# Patient Record
Sex: Male | Born: 1955 | Hispanic: Refuse to answer
Health system: Midwestern US, Community
[De-identification: ages and names within clinical notes are randomized; demographics above are authoritative.]

## PROBLEM LIST (undated history)

## (undated) DIAGNOSIS — I10 Essential (primary) hypertension: Secondary | ICD-10-CM

## (undated) DIAGNOSIS — Z85820 Personal history of malignant melanoma of skin: Secondary | ICD-10-CM

## (undated) DIAGNOSIS — R972 Elevated prostate specific antigen [PSA]: Secondary | ICD-10-CM

## (undated) DIAGNOSIS — M1990 Unspecified osteoarthritis, unspecified site: Secondary | ICD-10-CM

## (undated) HISTORY — PX: VASECTOMY: SHX75

---

## 2015-12-14 ENCOUNTER — Emergency Department: Payer: BLUE CROSS/BLUE SHIELD

## 2015-12-14 ENCOUNTER — Emergency Department
Admission: EM | Admit: 2015-12-14 | Discharge: 2015-12-14 | Disposition: A | Discharge status: Home / Self Care | Payer: BLUE CROSS/BLUE SHIELD | Attending: Emergency Medicine | Admitting: Emergency Medicine

## 2015-12-14 ENCOUNTER — Encounter: Payer: Self-pay | Admitting: Emergency Medicine

## 2015-12-14 DIAGNOSIS — I10 Essential (primary) hypertension: Secondary | ICD-10-CM | POA: Insufficient documentation

## 2015-12-14 DIAGNOSIS — R0602 Shortness of breath: Secondary | ICD-10-CM | POA: Diagnosis present

## 2015-12-14 DIAGNOSIS — Z87891 Personal history of nicotine dependence: Secondary | ICD-10-CM | POA: Diagnosis not present

## 2015-12-14 DIAGNOSIS — K219 Gastro-esophageal reflux disease without esophagitis: Secondary | ICD-10-CM

## 2015-12-14 HISTORY — DX: Essential (primary) hypertension: I10

## 2015-12-14 HISTORY — DX: Unspecified osteoarthritis, unspecified site: M19.90

## 2015-12-14 MED ORDER — PANTOPRAZOLE SODIUM 40 MG PO TBEC
40.0000 mg | DELAYED_RELEASE_TABLET | Freq: Once | ORAL | Status: AC
  Administered 2015-12-14: 40 mg via ORAL
  Filled 2015-12-14: qty 1

## 2015-12-14 MED ORDER — PANTOPRAZOLE SODIUM 40 MG PO TBEC: 40.0000 mg | DELAYED_RELEASE_TABLET | Freq: Every day | ORAL | 0 refills | Status: AC

## 2015-12-14 NOTE — ED Triage Notes (Signed)
Pt ambulatory to triage with steady with c/o shortness of breath this morning. Pt states "I felt like I was having to breath through something." Pt reports after "burping I was able to catch my breath." Pt denies chest pain or dizziness. Pt speaking in complete sentences, no increased work in breathing noted.

## 2015-12-14 NOTE — ED Notes (Signed)
Patient transported to X-ray 

## 2015-12-14 NOTE — ED Provider Notes (Signed)
[facility] Emergency Department Provider Note    First MD Initiated Contact with Patient 12/14/15 0310     (approximate)  I have reviewed the triage vital signs and the nursing notes.   HISTORY  Chief Complaint Shortness of Breath    HPI Mathew Hebert is a 60 y.o. male with no complaints at present however states that he awoke this morning noted it take several vomiting and his throat and difficulty breathing as well patient states that he went to the bathroom and after belching repetitively he was able to catch his breath". Patient denies any chest pain no dizziness. Patient states all symptoms resolved at this time   Past Medical History:  Diagnosis Date  . Arthritis   . Hypertension     There are no active problems to display for this patient.   Past Surgical History:  Procedure Laterality Date  . VASECTOMY      Prior to Admission medications   Medication Sig Start Date End Date Taking? Authorizing Provider  pantoprazole (PROTONIX) 40 MG tablet Take 1 tablet (40 mg total) by mouth daily. 12/14/15 01/13/16  Darci Currentandolph N Brown, MD    Allergies No known drug allergies No family history on file.  Social History Social History  Substance Use Topics  . Smoking status: Former Games developermoker  . Smokeless tobacco: Never Used  . Alcohol use Yes    Review of Systems Constitutional: No fever/chills Eyes: No visual changes. ENT: No sore throat. Cardiovascular: Denies chest pain. Respiratory: Denies shortness of breath. Gastrointestinal: No abdominal pain.  No nausea, no vomiting.  No diarrhea.  No constipation. Genitourinary: Negative for dysuria. Musculoskeletal: Negative for back pain. Skin: Negative for rash. Neurological: Negative for headaches, focal weakness or numbness.  10-point ROS otherwise negative.  ____________________________________________   PHYSICAL EXAM:  VITAL SIGNS: ED Triage Vitals [12/14/15 0253]  Enc Vitals Group       BP (!) 183/93     Pulse Rate 86     Resp 20     Temp 97.7 F (36.5 C)     Temp Source Oral     SpO2 96 %     Weight 185 lb (83.9 kg)     Height 5\' 7"  (1.702 m)     Head Circumference      Peak Flow      Pain Score      Pain Loc      Pain Edu?      Excl. in GC?     Constitutional: Alert and oriented. Well appearing and in no acute distress. Eyes: Conjunctivae are normal. PERRL. EOMI. Head: Atraumatic. Mouth/Throat: Mucous membranes are moist.  Oropharynx non-erythematous. Neck: No stridor.   Cardiovascular: Normal rate, regular rhythm. Good peripheral circulation. Grossly normal heart sounds. Respiratory: Normal respiratory effort.  No retractions. Lungs CTAB. Gastrointestinal: Soft and nontender. No distention.  Musculoskeletal: No lower extremity tenderness nor edema. No gross deformities of extremities. Neurologic:  Normal speech and language. No gross focal neurologic deficits are appreciated.  Skin:  Skin is warm, dry and intact. No rash noted. Psychiatric: Mood and affect are normal. Speech and behavior are normal.  __________________________________________ EKG  ED ECG REPORT I, [place] N BROWN, the attending physician, personally viewed and interpreted this ECG.   Date: 12/14/2015  EKG Time: 2:54 AM  Rate: 84  Rhythm: Normal sinus rhythm  Axis: Normal  Intervals: Normal  ST&T Change: None  ____________________________________________  RADIOLOGY I, [place] N BROWN, personally viewed and evaluated these  images (plain radiographs) as part of my medical decision making, as well as reviewing the written report by the radiologist.  Dg Chest 2 View  Result Date: 12/14/2015 CLINICAL DATA:  Shortness of breath this morning. EXAM: CHEST  2 VIEW COMPARISON:  None. FINDINGS: The heart size and mediastinal contours are within normal limits. Both lungs are clear. The visualized skeletal structures are unremarkable. IMPRESSION: No active cardiopulmonary disease.  Electronically Signed   By: Burman NievesWilliam  Stevens M.D.   On: 12/14/2015 03:22      Procedures    INITIAL IMPRESSION / ASSESSMENT AND PLAN / ED COURSE  Pertinent labs & imaging results that were available during my care of the patient were reviewed by me and considered in my medical decision making (see chart for details).  History of physical exam consistent with gastroesophageal reflux. EKG revealed no gross abnormality as well as chest x-ray. Patient advised to follow-up with primary care provider   Clinical Course     ____________________________________________  FINAL CLINICAL IMPRESSION(S) / ED DIAGNOSES  Final diagnoses:  Gastroesophageal reflux disease, esophagitis presence not specified     MEDICATIONS GIVEN DURING THIS VISIT:  Medications  pantoprazole (PROTONIX) EC tablet 40 mg (40 mg Oral Given 12/14/15 0405)     NEW OUTPATIENT MEDICATIONS STARTED DURING THIS VISIT:  Discharge Medication List as of 12/14/2015  4:17 AM    START taking these medications   Details  pantoprazole (PROTONIX) 40 MG tablet Take 1 tablet (40 mg total) by mouth daily., Starting Fri 12/14/2015, Until Sun 01/13/2016, Print        Discharge Medication List as of 12/14/2015  4:17 AM      Discharge Medication List as of 12/14/2015  4:17 AM       Note:  This document was prepared using Dragon voice recognition software and may include unintentional dictation errors.    Darci Currentandolph N Brown, MD 12/14/15 [phone]

## 2017-08-05 ENCOUNTER — Ambulatory Visit: Admit: 2017-08-05 | Discharge: 2017-08-05 | Payer: PRIVATE HEALTH INSURANCE | Attending: Internal Medicine

## 2017-08-05 ENCOUNTER — Ambulatory Visit: Attending: Internal Medicine

## 2017-08-05 DIAGNOSIS — Z85820 Personal history of malignant melanoma of skin: Secondary | ICD-10-CM

## 2017-08-05 MED ORDER — VARICELLA-ZOSTER GLYCOE VACC-AS01B ADJ(PF) 50 MCG/0.5 ML IM SUSPENSION
50 mcg/0.5 mL | Freq: Once | INTRAMUSCULAR | 0 refills | Status: AC
Start: 2017-08-05 — End: 2017-08-05

## 2017-08-05 NOTE — Progress Notes (Signed)
New Patient Evaluation    Dennis Pacheco is a 62 y.o. male.  They are here to establish care with the group and me as a primary care provider.    Went to the hospital in April of last year.  Was in a weakened state.  Does not know why he was there.      History of malignant melanoma-1992  His dermatologist is Dr. Dorna Bloom.  Not seen since Jan 2018.      He has a history of hypertension.  He has had this for 25 years.  He sees nephrology (Dr. Janetta Hora).      He has had colonscopy last March.  Done every 5 years.  Done with Dr. Clovis Riley.      No prostate issues.      There are no active problems to display for this patient.    Current Outpatient Medications   Medication Sig Dispense Refill   ??? amLODIPine (NORVASC) 10 mg tablet TAKE ONE TABLET BY MOUTH ONE TIME DAILY  2   ??? losartan (COZAAR) 50 mg tablet TAKE ONE TABLET BY MOUTH ONE TIME DAILY  3   ??? aspirin (ASPIRIN) 325 mg tablet Take 325 mg by mouth daily.       Allergies   Allergen Reactions   ??? Nut - Unspecified Hives     madadamian     History reviewed. No pertinent past medical history.  Past Surgical History:   Procedure Laterality Date   ??? HX SKIN BIOPSY  1992     Family History   Problem Relation Age of Onset   ??? Hypertension Mother    ??? Colon Cancer Mother    ??? Hypertension Paternal Grandmother    ??? Other Father      Social History     Tobacco Use   ??? Smoking status: Former Smoker   ??? Smokeless tobacco: Never Used   Substance Use Topics   ??? Alcohol use: Yes     Comment: 10        Health Maintenance   Topic Date Due   ??? Hepatitis C Screening  16-Mar-1955   ??? Shingrix Vaccine Age 47> (1 of 2) 10/18/2005   ??? FOBT Q 1 YEAR AGE 86-75  10/18/2005   ??? Influenza Age 60 to Adult  08/20/2017   ??? DTaP/Tdap/Td series (2 - Td) 08/05/2025   ??? Pneumococcal 0-64 years  Aged Out       Review of Systems   Constitutional: Negative.    Respiratory: Negative.    Cardiovascular: Negative.          Visit Vitals  BP 172/90 (BP 1 Location: Left arm, BP Patient Position: Sitting)    Pulse 71   Temp 98.9 ??F (37.2 ??C) (Oral)   Resp 18   Ht 5' 5.95" (1.675 m)   Wt 191 lb 9.6 oz (86.9 kg)   SpO2 95%   BMI 30.98 kg/m??       Physical Exam   Constitutional: No distress.   Cardiovascular: Normal rate and regular rhythm.   Pulmonary/Chest: Effort normal and breath sounds normal.           ASSESSMENT/PLAN    Diagnoses and all orders for this visit:    1. History of melanoma  -     REFERRAL TO DERMATOLOGY    2. Essential hypertension  -     CBC WITH AUTOMATED DIFF  -     METABOLIC PANEL, COMPREHENSIVE  -     LIPID  PANEL  -     TSH 3RD GENERATION    3. Encounter to establish care    4. Need for shingles vaccine  -     varicella-zoster recombinant, PF, (SHINGRIX, PF,) 50 mcg/0.5 mL susr injection; 0.5 mL by IntraMUSCular route once for 1 dose.    5. Encounter for hepatitis C screening test for low risk patient  -     HEPATITIS C AB    6. Prostate cancer screening  -     PSA SCREENING (SCREENING)            -Discussed with the patient to continue the current plan of care.  We will obtain baseline labwork and determine if any adjustments need to be done.  We will also await the records of the previous PCP to ascertain further details of the patient's history. The patient agrees with and understands the plan of care. All questions have been answered.

## 2017-08-05 NOTE — Progress Notes (Signed)
New Patient Evaluation    Dennis Pacheco is a 62 y.o. male.  They are here to establish care with the group and me as a primary care provider.    Went to the hospital in April of last year.  Was in a weakened state.  Does not know why he was there.      History of malignant melanoma-1992  His dermatologist is Dr. Dorna BloomWu.  Not seen since Jan 2018.      He has a history of hypertension.  He has had this for 25 years.  He sees nephrology (Dr. Janetta HoraAbou-Assi).      He has had colonscopy last March.  Done every 5 years.  Done with Dr. Clovis RileyMitchell.      No prostate issues.      There are no active problems to display for this patient.    Current Outpatient Medications   Medication Sig Dispense Refill   ??? amLODIPine (NORVASC) 10 mg tablet TAKE ONE TABLET BY MOUTH ONE TIME DAILY  2   ??? losartan (COZAAR) 50 mg tablet TAKE ONE TABLET BY MOUTH ONE TIME DAILY  3   ??? aspirin (ASPIRIN) 325 mg tablet Take 325 mg by mouth daily.       Allergies   Allergen Reactions   ??? Nut - Unspecified Hives     madadamian     History reviewed. No pertinent past medical history.  Past Surgical History:   Procedure Laterality Date   ??? HX SKIN BIOPSY  1992     Family History   Problem Relation Age of Onset   ??? Hypertension Mother    ??? Colon Cancer Mother    ??? Hypertension Paternal Grandmother    ??? Other Father      Social History     Tobacco Use   ??? Smoking status: Former Smoker   ??? Smokeless tobacco: Never Used   Substance Use Topics   ??? Alcohol use: Yes     Comment: 10        Health Maintenance   Topic Date Due   ??? Hepatitis C Screening  10/01/1955   ??? Shingrix Vaccine Age 37> (1 of 2) 10/18/2005   ??? FOBT Q 1 YEAR AGE 69-75  10/18/2005   ??? Influenza Age 709 to Adult  08/20/2017   ??? DTaP/Tdap/Td series (2 - Td) 08/05/2025   ??? Pneumococcal 0-64 years  Aged Out       Review of Systems   Constitutional: Negative.    Respiratory: Negative.    Cardiovascular: Negative.          Visit Vitals  BP 172/90 (BP 1 Location: Left arm, BP Patient Position: Sitting)   Pulse  71   Temp 98.9 ??F (37.2 ??C) (Oral)   Resp 18   Ht 5' 5.95" (1.675 m)   Wt 191 lb 9.6 oz (86.9 kg)   SpO2 95%   BMI 30.98 kg/m??       Physical Exam   Constitutional: No distress.   Cardiovascular: Normal rate and regular rhythm.   Pulmonary/Chest: Effort normal and breath sounds normal.           ASSESSMENT/PLAN    Diagnoses and all orders for this visit:    1. History of melanoma  -     REFERRAL TO DERMATOLOGY    2. Essential hypertension  -     CBC WITH AUTOMATED DIFF  -     METABOLIC PANEL, COMPREHENSIVE  -     LIPID  PANEL  -     TSH 3RD GENERATION    3. Encounter to establish care    4. Need for shingles vaccine  -     varicella-zoster recombinant, PF, (SHINGRIX, PF,) 50 mcg/0.5 mL susr injection; 0.5 mL by IntraMUSCular route once for 1 dose.    5. Encounter for hepatitis C screening test for low risk patient  -     HEPATITIS C AB    6. Prostate cancer screening  -     PSA SCREENING (SCREENING)            -Discussed with the patient to continue the current plan of care.  We will obtain baseline labwork and determine if any adjustments need to be done.  We will also await the records of the previous PCP to ascertain further details of the patient's history. The patient agrees with and understands the plan of care. All questions have been answered.

## 2017-08-14 LAB — METABOLIC PANEL, COMPREHENSIVE
A-G Ratio: 1.7 (ref 1.2–2.2)
ALT (SGPT): 40 IU/L (ref 0–44)
AST (SGOT): 22 IU/L (ref 0–40)
Albumin: 4.8 g/dL (ref 3.6–4.8)
Alk. phosphatase: 104 IU/L (ref 39–117)
BUN/Creatinine ratio: 14 (ref 10–24)
BUN: 13 mg/dL (ref 8–27)
Bilirubin, total: 1.4 mg/dL — ABNORMAL HIGH (ref 0.0–1.2)
CO2: 26 mmol/L (ref 20–29)
Calcium: 9.2 mg/dL (ref 8.6–10.2)
Chloride: 97 mmol/L (ref 96–106)
Creatinine: 0.95 mg/dL (ref 0.76–1.27)
GFR est AA: 99 mL/min/{1.73_m2} (ref >59)
GFR est non-AA: 86 mL/min/{1.73_m2} (ref >59)
GLOBULIN, TOTAL: 2.9 g/dL (ref 1.5–4.5)
Glucose: 123 mg/dL — ABNORMAL HIGH (ref 65–99)
Potassium: 3.3 mmol/L — ABNORMAL LOW (ref 3.5–5.2)
Protein, total: 7.7 g/dL (ref 6.0–8.5)
Sodium: 142 mmol/L (ref 134–144)

## 2017-08-14 LAB — CBC WITH AUTOMATED DIFF
ABS. BASOPHILS: 0 10*3/uL (ref 0.0–0.2)
ABS. EOSINOPHILS: 0.2 10*3/uL (ref 0.0–0.4)
ABS. IMM. GRANS.: 0.1 10*3/uL (ref 0.0–0.1)
ABS. MONOCYTES: 0.9 10*3/uL (ref 0.1–0.9)
ABS. NEUTROPHILS: 5.6 10*3/uL (ref 1.4–7.0)
Abs Lymphocytes: 1.8 10*3/uL (ref 0.7–3.1)
BASOPHILS: 0 %
EOSINOPHILS: 2 %
HCT: 48.8 % (ref 37.5–51.0)
HGB: 16.8 g/dL (ref 13.0–17.7)
IMMATURE GRANULOCYTES: 1 %
Lymphocytes: 21 %
MCH: 32.6 pg (ref 26.6–33.0)
MCHC: 34.4 g/dL (ref 31.5–35.7)
MCV: 95 fL (ref 79–97)
MONOCYTES: 10 %
NEUTROPHILS: 66 %
PLATELET: 214 10*3/uL (ref 150–450)
RBC: 5.15 x10E6/uL (ref 4.14–5.80)
RDW: 16.1 % — ABNORMAL HIGH (ref 12.3–15.4)
WBC: 8.6 10*3/uL (ref 3.4–10.8)

## 2017-08-14 LAB — LIPID PANEL
Cholesterol, Total: 192 mg/dL (ref 100–199)
Cholesterol, total: 192 mg/dL (ref 100–199)
HDL Cholesterol: 47 mg/dL (ref >39)
HDL: 47 mg/dL (ref >39)
LDL Calculated: 121 mg/dL — ABNORMAL HIGH (ref 0–99)
LDL, calculated: 121 mg/dL — ABNORMAL HIGH (ref 0–99)
Triglyceride: 118 mg/dL (ref 0–149)
Triglycerides: 118 mg/dL (ref 0–149)
VLDL Cholesterol Calculated: 24 mg/dL (ref 5–40)
VLDL, calculated: 24 mg/dL (ref 5–40)

## 2017-08-14 LAB — HEPATITIS C AB: Hep C Virus Ab: <0.1 s/co ratio (ref 0.0–0.9)

## 2017-08-14 LAB — TSH 3RD GENERATION
TSH: 1.63 u[IU]/mL (ref 0.450–4.500)
TSH: 1.63 u[IU]/mL (ref 0.450–4.500)

## 2017-08-14 LAB — PSA SCREENING (SCREENING): Prostate Specific Ag: 5.3 ng/mL — ABNORMAL HIGH (ref 0.0–4.0)

## 2017-08-14 LAB — CBC WITH AUTO DIFFERENTIAL
Basophils %: 0 %
Basophils Absolute: 0 10*3/uL (ref 0.0–0.2)
Eosinophils %: 2 %
Eosinophils Absolute: 0.2 10*3/uL (ref 0.0–0.4)
Granulocyte Absolute Count: 0.1 10*3/uL (ref 0.0–0.1)
Hematocrit: 48.8 % (ref 37.5–51.0)
Hemoglobin: 16.8 g/dL (ref 13.0–17.7)
Immature Granulocytes: 1 %
Lymphocytes %: 21 %
Lymphocytes Absolute: 1.8 10*3/uL (ref 0.7–3.1)
MCH: 32.6 pg (ref 26.6–33.0)
MCHC: 34.4 g/dL (ref 31.5–35.7)
MCV: 95 fL (ref 79–97)
Monocytes %: 10 %
Monocytes Absolute: 0.9 10*3/uL (ref 0.1–0.9)
Neutrophils %: 66 %
Neutrophils Absolute: 5.6 10*3/uL (ref 1.4–7.0)
Platelets: 214 10*3/uL (ref 150–450)
RBC: 5.15 x10E6/uL (ref 4.14–5.80)
RDW: 16.1 % — ABNORMAL HIGH (ref 12.3–15.4)
WBC: 8.6 10*3/uL (ref 3.4–10.8)

## 2017-08-14 LAB — COMPREHENSIVE METABOLIC PANEL
ALT: 40 IU/L (ref 0–44)
AST: 22 IU/L (ref 0–40)
Albumin/Globulin Ratio: 1.7 NA (ref 1.2–2.2)
Albumin: 4.8 g/dL (ref 3.6–4.8)
Alkaline Phosphatase: 104 IU/L (ref 39–117)
BUN: 13 mg/dL (ref 8–27)
Bun/Cre Ratio: 14 NA (ref 10–24)
CO2: 26 mmol/L (ref 20–29)
Calcium: 9.2 mg/dL (ref 8.6–10.2)
Chloride: 97 mmol/L (ref 96–106)
Creatinine: 0.95 mg/dL (ref 0.76–1.27)
EGFR IF NonAfrican American: 86 mL/min/{1.73_m2} (ref >59)
GFR African American: 99 mL/min/{1.73_m2} (ref >59)
Globulin, Total: 2.9 g/dL (ref 1.5–4.5)
Glucose: 123 mg/dL — ABNORMAL HIGH (ref 65–99)
Potassium: 3.3 mmol/L — ABNORMAL LOW (ref 3.5–5.2)
Sodium: 142 mmol/L (ref 134–144)
Total Bilirubin: 1.4 mg/dL — ABNORMAL HIGH (ref 0.0–1.2)
Total Protein: 7.7 g/dL (ref 6.0–8.5)

## 2017-08-14 LAB — PSA SCREENING: PSA: 5.3 ng/mL — ABNORMAL HIGH (ref 0.0–4.0)

## 2017-08-14 LAB — HEPATITIS C ANTIBODY: HCV Ab: <0.1 s/co ratio (ref 0.0–0.9)

## 2017-08-30 IMAGING — CR DG CHEST 2V
2 series · 2 of 2 positions shown · non-contrast
Comparison: None.

CLINICAL DATA: Shortness of breath this morning.

EXAM:
CHEST  2 VIEW

[chest pa]
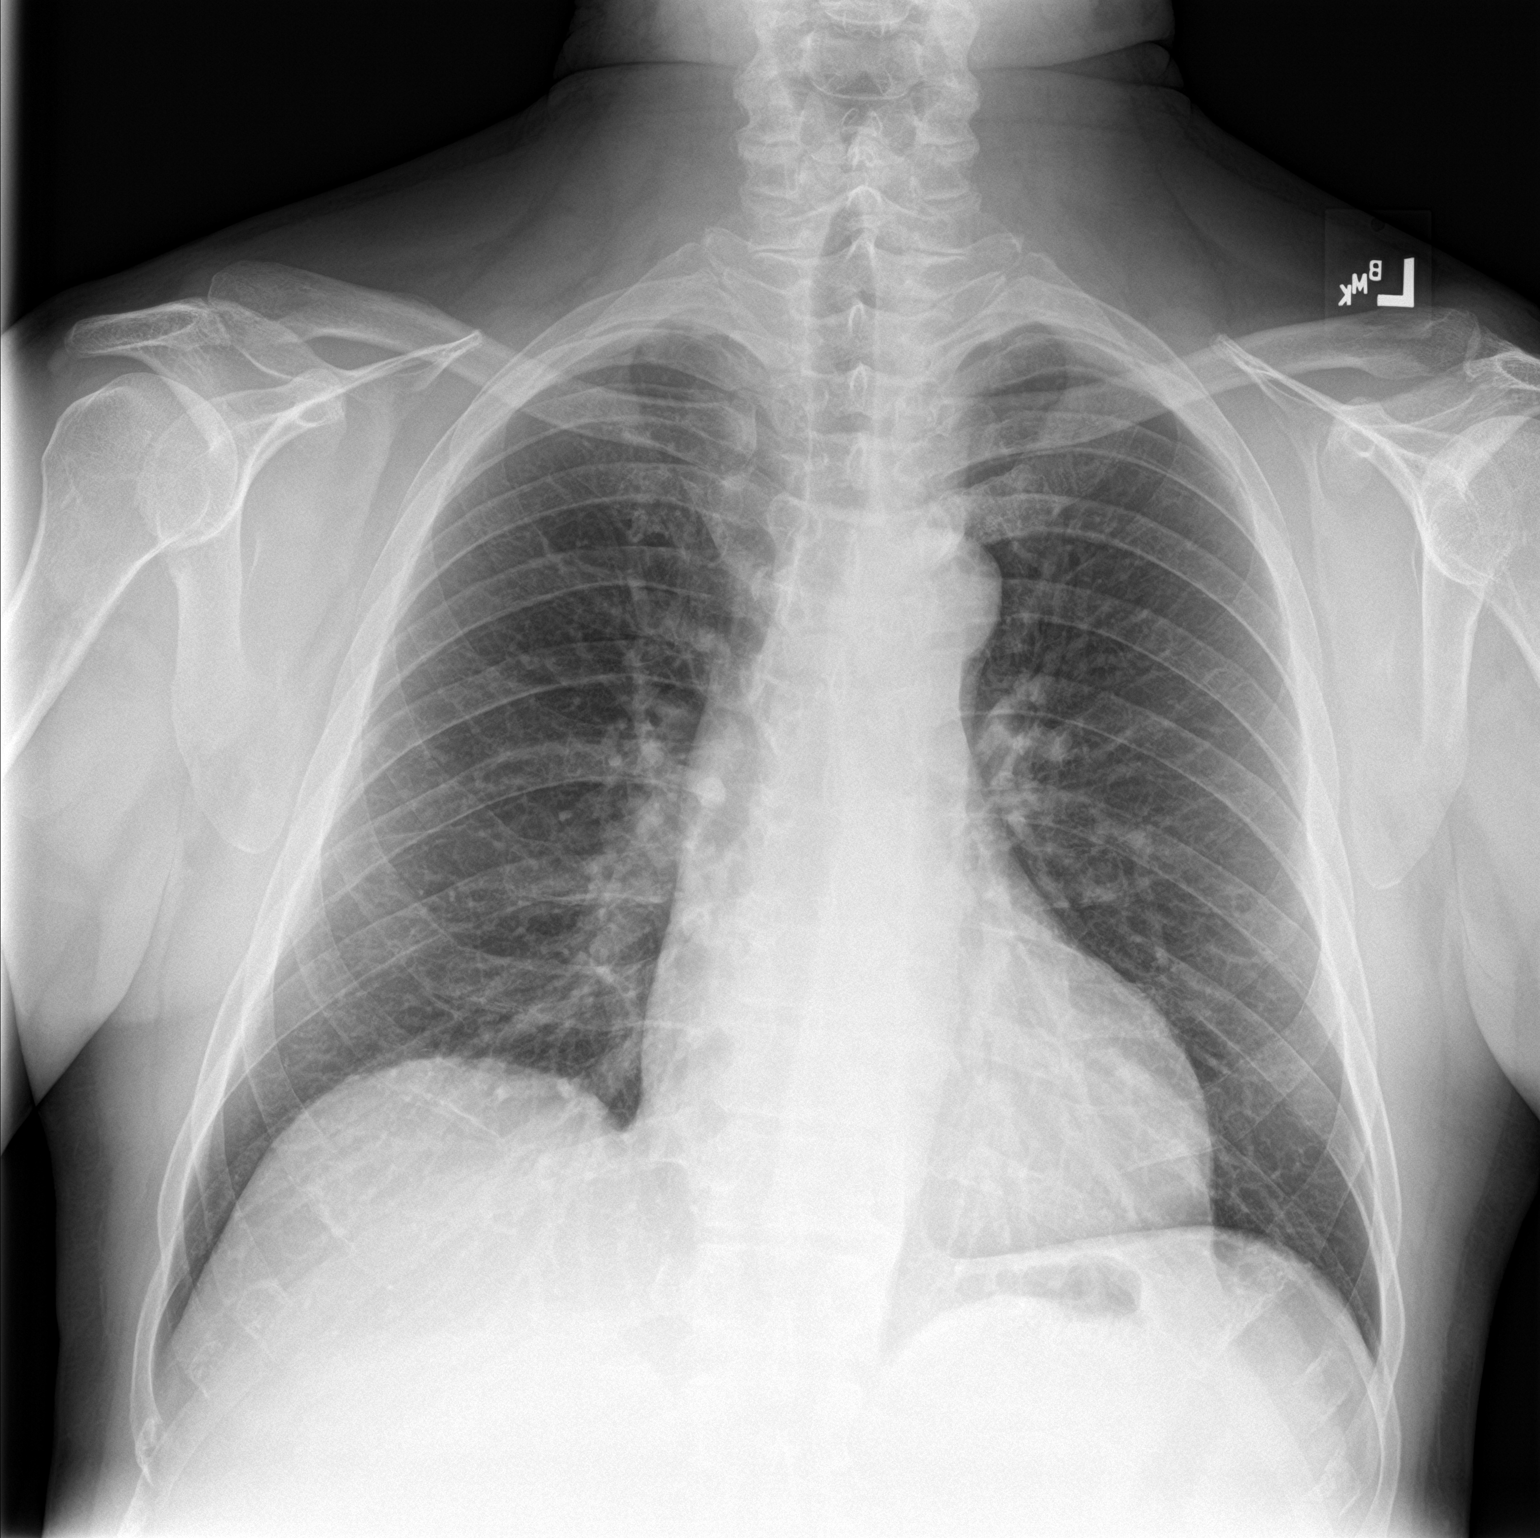

[chest lat]
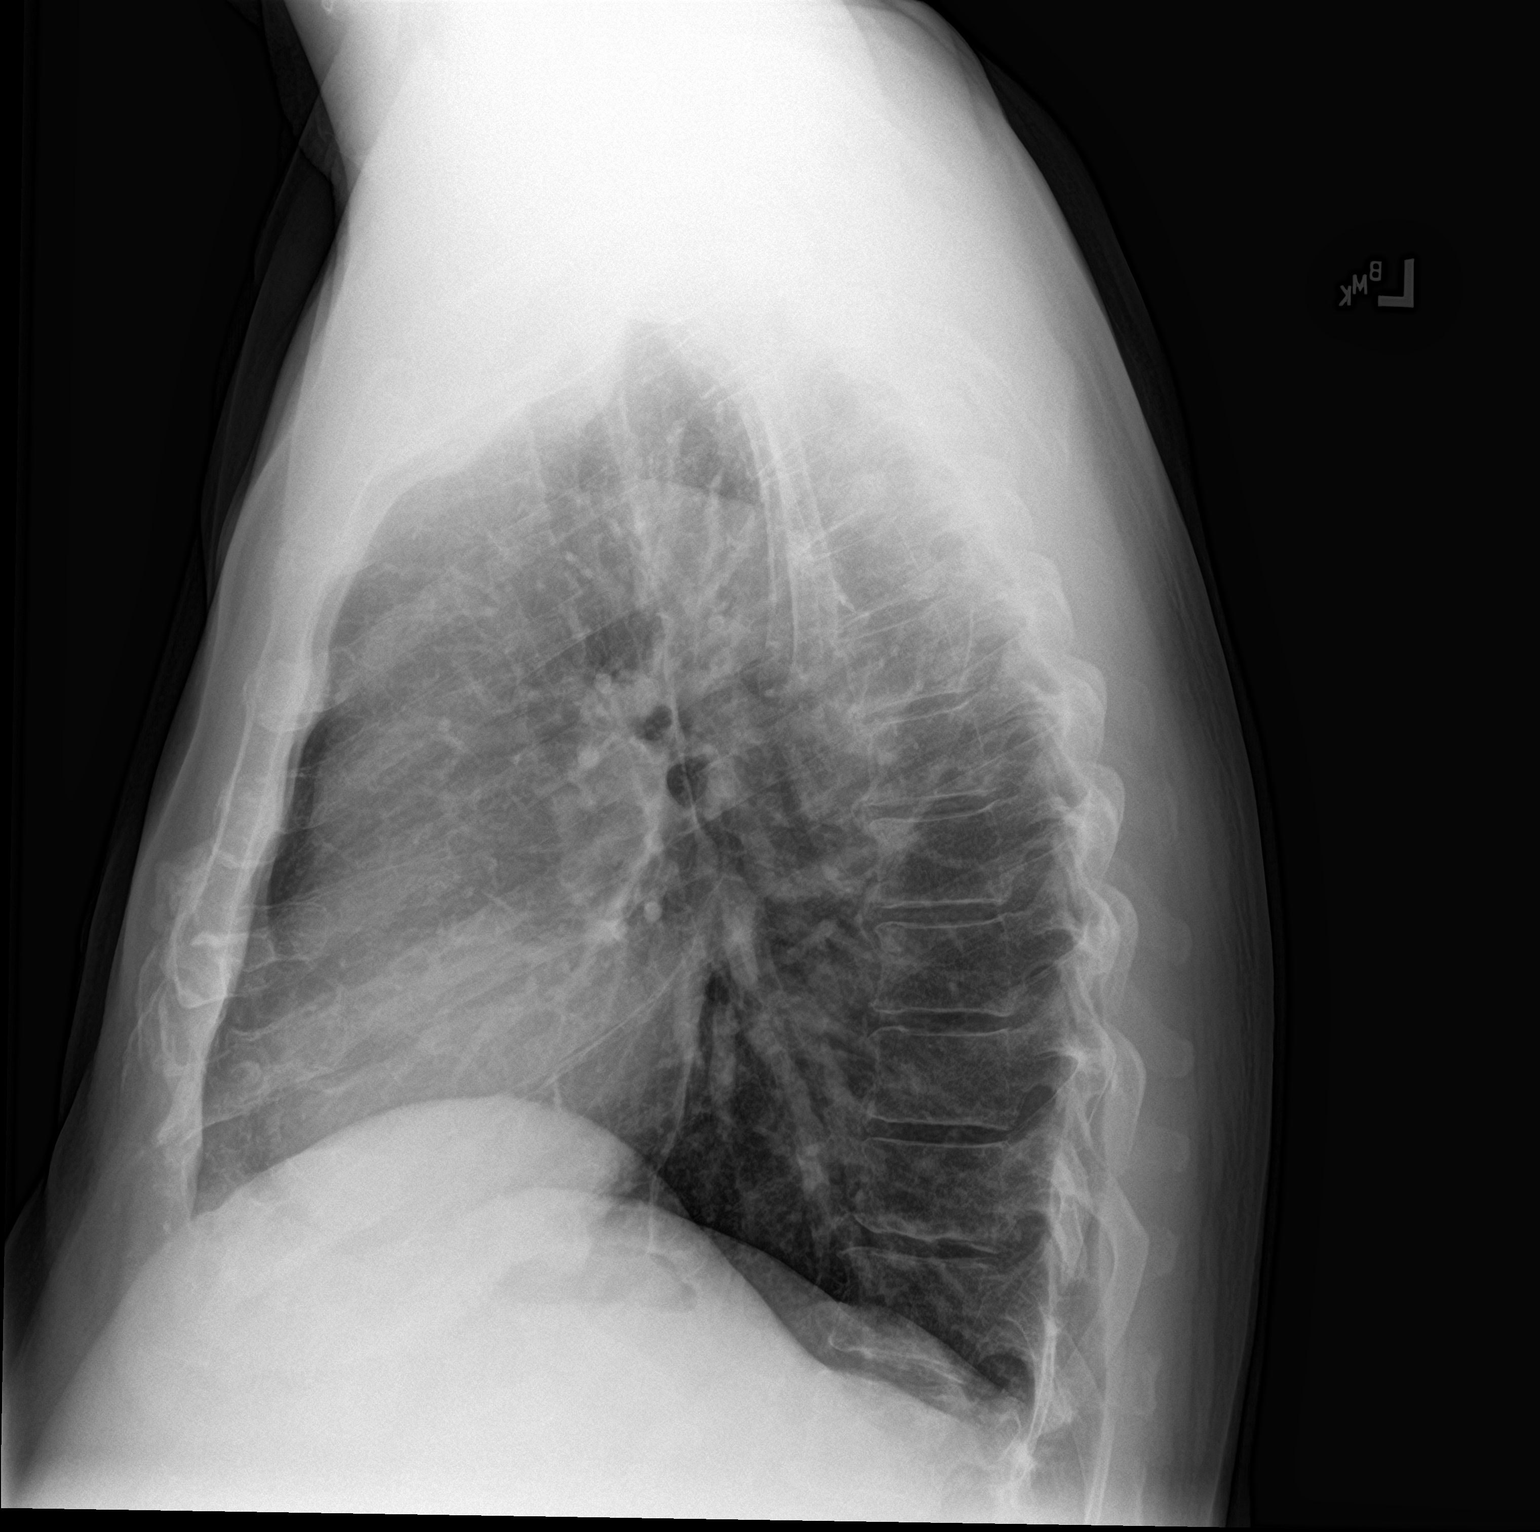

[2 of 2 positions shown; findings below may reference images not displayed]

FINDINGS: The heart size and mediastinal contours are within normal limits.
Both lungs are clear. The visualized skeletal structures are
unremarkable.
IMPRESSION: No active cardiopulmonary disease.

## 2017-09-07 ENCOUNTER — Ambulatory Visit: Admit: 2017-09-07 | Discharge: 2017-09-07 | Payer: PRIVATE HEALTH INSURANCE | Attending: Internal Medicine

## 2017-09-07 ENCOUNTER — Ambulatory Visit: Attending: Internal Medicine

## 2017-09-07 DIAGNOSIS — R972 Elevated prostate specific antigen [PSA]: Secondary | ICD-10-CM

## 2017-09-07 MED ORDER — METOPROLOL SUCCINATE SR 50 MG 24 HR TAB
50 mg | ORAL_TABLET | Freq: Every day | ORAL | 1 refills | Status: DC
Start: 2017-09-07 — End: 2018-02-14

## 2017-09-07 NOTE — Progress Notes (Signed)
Follow Up Visit    Dennis Pacheco is a 62 y.o. male.  he presents for Hypertension    Cardiovascular Review  The patient has hypertension.  He reports taking medications as instructed, no medication side effects noted, no chest pain on exertion, no dyspnea on exertion.  Diet and Lifestyle: generally follows a low fat low cholesterol diet, generally follows a low sodium diet, exercises sporadically.  Lab review: labs reviewed, I note that PSA normal, abnormal (elevated to greater than 5), labs reviewed and discussed with patient.      He has had the first shingles shot.  Waiting on the second now.     The patient has a history of arthritis.  In 2015 he had hip pain.  Went to see a physician.  Had XR's and was given an NSAID (Dr. Shaia).  He was offered a cortisone shot.  He has ongoing right hip pain and his thumbs.      Patient Active Problem List   Diagnosis Code   ??? Elevated PSA R97.20   ??? Essential hypertension I10   ??? History of melanoma Z85.820   ??? Hip arthritis M16.10         Prior to Admission medications    Medication Sig Start Date End Date Taking? Authorizing Provider   metoprolol succinate (TOPROL-XL) 50 mg XL tablet Take 1 Tab by mouth daily. Increase to 2 tabs daily after one week 09/07/17  Yes Lawson, Danaria Larsen V, MD   amLODIPine (NORVASC) 10 mg tablet TAKE ONE TABLET BY MOUTH ONE TIME DAILY 07/18/17  Yes Provider, Historical   losartan (COZAAR) 50 mg tablet TAKE ONE TABLET BY MOUTH ONE TIME DAILY 07/18/17  Yes Provider, Historical   aspirin (ASPIRIN) 325 mg tablet Take 325 mg by mouth daily.   Yes Provider, Historical         Health Maintenance   Topic Date Due   ??? Shingrix Vaccine Age 50> (1 of 2) 10/18/2005   ??? Influenza Age 9 to Adult  08/20/2017   ??? COLONOSCOPY  03/04/2022   ??? DTaP/Tdap/Td series (2 - Td) 08/05/2025   ??? Hepatitis C Screening  Completed   ??? Pneumococcal 0-64 years  Aged Out       Review of Systems   Constitutional: Negative.    Respiratory: Negative.    Cardiovascular: Negative.     Gastrointestinal: Negative.            Visit Vitals  BP 185/80 (BP 1 Location: Left arm, BP Patient Position: Sitting)   Pulse 80   Temp 99.2 ??F (37.3 ??C) (Oral)   Resp 19   Ht 5' 5.95" (1.675 m)   Wt 194 lb (88 kg)   SpO2 99%   BMI 31.36 kg/m??       Physical Exam   Constitutional: No distress.   HENT:   Mouth/Throat: Oropharynx is clear and moist.   Cardiovascular: Normal rate and regular rhythm.   Pulmonary/Chest: Effort normal and breath sounds normal.         ASSESSMENT/PLAN    Diagnoses and all orders for this visit:    1. Elevated PSA  -     REFERRAL TO UROLOGY    2. Essential hypertension -he will begin metoprolol 50 mg daily.  After 1 week, he will increase this to 2 tabs daily.  He will inform us as to his blood pressure during this time.  -     metoprolol succinate (TOPROL-XL) 50 mg XL tablet; Take 1 Tab   by mouth daily. Increase to 2 tabs daily after one week    3. History of melanoma    4. Hip arthritis      Follow-up and Dispositions    ?? Return in about 3 months (around 12/08/2017) for Follow up blood pressure.

## 2017-09-07 NOTE — Progress Notes (Signed)
Follow Up Visit    Dennis Pacheco is a 62 y.o. male.  he presents for Hypertension    Cardiovascular Review  The patient has hypertension.  He reports taking medications as instructed, no medication side effects noted, no chest pain on exertion, no dyspnea on exertion.  Diet and Lifestyle: generally follows a low fat low cholesterol diet, generally follows a low sodium diet, exercises sporadically.  Lab review: labs reviewed, I note that PSA normal, abnormal (elevated to greater than 5), labs reviewed and discussed with patient.      He has had the first shingles shot.  Waiting on the second now.     The patient has a history of arthritis.  In 2015 he had hip pain.  Went to see a physician.  Had XR's and was given an NSAID (Dr. Woodroe ChenShaia).  He was offered a cortisone shot.  He has ongoing right hip pain and his thumbs.      Patient Active Problem List   Diagnosis Code   ??? Elevated PSA R97.20   ??? Essential hypertension I10   ??? History of melanoma Z85.820   ??? Hip arthritis M16.10         Prior to Admission medications    Medication Sig Start Date End Date Taking? Authorizing Provider   metoprolol succinate (TOPROL-XL) 50 mg XL tablet Take 1 Tab by mouth daily. Increase to 2 tabs daily after one week 09/07/17  Yes Denice BorsLawson, Raekwan Spelman V, MD   amLODIPine (NORVASC) 10 mg tablet TAKE ONE TABLET BY MOUTH ONE TIME DAILY 07/18/17  Yes Provider, Historical   losartan (COZAAR) 50 mg tablet TAKE ONE TABLET BY MOUTH ONE TIME DAILY 07/18/17  Yes Provider, Historical   aspirin (ASPIRIN) 325 mg tablet Take 325 mg by mouth daily.   Yes Provider, Historical         Health Maintenance   Topic Date Due   ??? Shingrix Vaccine Age 62> (1 of 2) 10/18/2005   ??? Influenza Age 359 to Adult  08/20/2017   ??? COLONOSCOPY  03/04/2022   ??? DTaP/Tdap/Td series (2 - Td) 08/05/2025   ??? Hepatitis C Screening  Completed   ??? Pneumococcal 0-64 years  Aged Out       Review of Systems   Constitutional: Negative.    Respiratory: Negative.    Cardiovascular: Negative.     Gastrointestinal: Negative.            Visit Vitals  BP 185/80 (BP 1 Location: Left arm, BP Patient Position: Sitting)   Pulse 80   Temp 99.2 ??F (37.3 ??C) (Oral)   Resp 19   Ht 5' 5.95" (1.675 m)   Wt 194 lb (88 kg)   SpO2 99%   BMI 31.36 kg/m??       Physical Exam   Constitutional: No distress.   HENT:   Mouth/Throat: Oropharynx is clear and moist.   Cardiovascular: Normal rate and regular rhythm.   Pulmonary/Chest: Effort normal and breath sounds normal.         ASSESSMENT/PLAN    Diagnoses and all orders for this visit:    1. Elevated PSA  -     REFERRAL TO UROLOGY    2. Essential hypertension -he will begin metoprolol 50 mg daily.  After 1 week, he will increase this to 2 tabs daily.  He will inform us as to his blood pressure during this time.  -     metoprolol succinate (TOPROL-XL) 50 mg XL tablet; Take 1 Tab  by mouth daily. Increase to 2 tabs daily after one week    3. History of melanoma    4. Hip arthritis      Follow-up and Dispositions    ?? Return in about 3 months (around 12/08/2017) for Follow up blood pressure.

## 2017-11-26 NOTE — Telephone Encounter (Signed)
Requested Prescriptions     Pending Prescriptions Disp Refills   ??? amLODIPine (NORVASC) 10 mg tablet  2       [id number] - [place], [place] - [address]????[phone]      Patient is requesting a 90 day supply

## 2017-11-26 NOTE — Telephone Encounter (Signed)
Hx provider

## 2017-11-30 MED ORDER — AMLODIPINE 10 MG TAB
10 mg | ORAL_TABLET | Freq: Every day | ORAL | 1 refills | Status: DC
Start: 2017-11-30 — End: 2018-05-12

## 2017-12-08 ENCOUNTER — Encounter: Attending: Internal Medicine

## 2018-02-13 ENCOUNTER — Encounter

## 2018-02-15 MED ORDER — METOPROLOL SUCCINATE SR 50 MG 24 HR TAB
50 mg | ORAL_TABLET | Freq: Every day | ORAL | 1 refills | Status: DC
Start: 2018-02-15 — End: 2018-03-22

## 2018-03-03 ENCOUNTER — Ambulatory Visit: Admit: 2018-03-03 | Discharge: 2018-03-03 | Payer: PRIVATE HEALTH INSURANCE | Attending: Internal Medicine

## 2018-03-03 ENCOUNTER — Ambulatory Visit: Attending: Internal Medicine

## 2018-03-03 DIAGNOSIS — R972 Elevated prostate specific antigen [PSA]: Secondary | ICD-10-CM

## 2018-03-03 NOTE — Progress Notes (Signed)
Follow Up Visit    Dennis Pacheco is a 63 y.o. male.  he presents for Hypertension    He needs to see urology.  He has and elevated PSA.  Referral order given again.       Cardiovascular Review  The patient has hypertension.  He reports taking medications as instructed, no medication side effects noted.  Diet and Lifestyle: generally follows a low fat low cholesterol diet, generally follows a low sodium diet, sedentary.  Lab review: no lab studies available for review at time of visit.  We will recheck these in about 3 months.       He has some arthritis pain.  Has been taking tylenol.  He had some increased pain recently at the neck.  Changed his sleeping position and now he is feeling better.      Patient Active Problem List   Diagnosis Code   ??? Elevated PSA R97.20   ??? Essential hypertension I10   ??? History of melanoma Z85.820   ??? Hip arthritis M16.10         Prior to Admission medications    Medication Sig Start Date End Date Taking? Authorizing Provider   ascorbic acid, vitamin C, (VITAMIN C) 500 mg tablet Take 1,000 mg by mouth daily.   Yes Provider, Historical   metoprolol succinate (TOPROL-XL) 50 mg XL tablet TAKE 1 TAB BY MOUTH DAILY. INCREASE TO 2 TABS DAILY AFTER ONE WEEK 02/14/18  Yes Denice Bors, MD   amLODIPine (NORVASC) 10 mg tablet Take 1 Tab by mouth daily. 11/30/17  Yes Denice Bors, MD   losartan (COZAAR) 50 mg tablet TAKE ONE TABLET BY MOUTH ONE TIME DAILY 07/18/17  Yes Provider, Historical   aspirin (ASPIRIN) 325 mg tablet Take 325 mg by mouth daily.   Yes Provider, Historical         Health Maintenance   Topic Date Due   ??? Influenza Age 81 to Adult  08/20/2017   ??? Shingrix Vaccine Age 53> (2 of 2) 10/02/2017   ??? Colonoscopy  03/04/2022   ??? Lipid Screen  08/14/2022   ??? DTaP/Tdap/Td series (2 - Td) 08/05/2025   ??? Hepatitis C Screening  Completed   ??? Pneumococcal 0-64 years  Aged Out       Review of Systems   Constitutional: Negative.    Cardiovascular: Negative.     Gastrointestinal: Negative.            Visit Vitals  BP 174/90 (BP 1 Location: Left arm, BP Patient Position: Sitting)   Pulse 100   Temp 98.7 ??F (37.1 ??C) (Oral)   Resp 16   Ht 5\' 5"  (1.651 m)   Wt 192 lb 12.8 oz (87.5 kg)   SpO2 96%   BMI 32.08 kg/m??       Physical Exam  Constitutional:       Appearance: Normal appearance.   Cardiovascular:      Rate and Rhythm: Normal rate and regular rhythm.   Pulmonary:      Effort: Pulmonary effort is normal.      Breath sounds: Normal breath sounds.   Neurological:      Mental Status: He is alert.           ASSESSMENT/PLAN    Diagnoses and all orders for this visit:    1. Elevated PSA  -     REFERRAL TO UROLOGY    2. Essential hypertension - Continue medications and avoid sodium.  He will return  for follow up in 3 months.     Follow-up and Dispositions    ?? Return in about 3 months (around 06/01/2018) for Follow up blood pressure.

## 2018-03-03 NOTE — Progress Notes (Signed)
Pt. Is here for Htn.

## 2018-03-03 NOTE — Progress Notes (Signed)
Follow Up Visit    Dennis Pacheco is a 63 y.o. male.  he presents for Hypertension    He needs to see urology.  He has and elevated PSA.  Referral order given again.       Cardiovascular Review  The patient has hypertension.  He reports taking medications as instructed, no medication side effects noted.  Diet and Lifestyle: generally follows a low fat low cholesterol diet, generally follows a low sodium diet, sedentary.  Lab review: no lab studies available for review at time of visit.  We will recheck these in about 3 months.       He has some arthritis pain.  Has been taking tylenol.  He had some increased pain recently at the neck.  Changed his sleeping position and now he is feeling better.      Patient Active Problem List   Diagnosis Code   ??? Elevated PSA R97.20   ??? Essential hypertension I10   ??? History of melanoma Z85.820   ??? Hip arthritis M16.10         Prior to Admission medications    Medication Sig Start Date End Date Taking? Authorizing Provider   ascorbic acid, vitamin C, (VITAMIN C) 500 mg tablet Take 1,000 mg by mouth daily.   Yes Provider, Historical   metoprolol succinate (TOPROL-XL) 50 mg XL tablet TAKE 1 TAB BY MOUTH DAILY. INCREASE TO 2 TABS DAILY AFTER ONE WEEK 02/14/18  Yes Denice Bors, MD   amLODIPine (NORVASC) 10 mg tablet Take 1 Tab by mouth daily. 11/30/17  Yes Denice Bors, MD   losartan (COZAAR) 50 mg tablet TAKE ONE TABLET BY MOUTH ONE TIME DAILY 07/18/17  Yes Provider, Historical   aspirin (ASPIRIN) 325 mg tablet Take 325 mg by mouth daily.   Yes Provider, Historical         Health Maintenance   Topic Date Due   ??? Influenza Age 85 to Adult  08/20/2017   ??? Shingrix Vaccine Age 60> (2 of 2) 10/02/2017   ??? Colonoscopy  03/04/2022   ??? Lipid Screen  08/14/2022   ??? DTaP/Tdap/Td series (2 - Td) 08/05/2025   ??? Hepatitis C Screening  Completed   ??? Pneumococcal 0-64 years  Aged Out       Review of Systems   Constitutional: Negative.    Cardiovascular: Negative.    Gastrointestinal:  Negative.            Visit Vitals  BP 174/90 (BP 1 Location: Left arm, BP Patient Position: Sitting)   Pulse 100   Temp 98.7 ??F (37.1 ??C) (Oral)   Resp 16   Ht 5\' 5"  (1.651 m)   Wt 192 lb 12.8 oz (87.5 kg)   SpO2 96%   BMI 32.08 kg/m??       Physical Exam  Constitutional:       Appearance: Normal appearance.   Cardiovascular:      Rate and Rhythm: Normal rate and regular rhythm.   Pulmonary:      Effort: Pulmonary effort is normal.      Breath sounds: Normal breath sounds.   Neurological:      Mental Status: He is alert.           ASSESSMENT/PLAN    Diagnoses and all orders for this visit:    1. Elevated PSA  -     REFERRAL TO UROLOGY    2. Essential hypertension - Continue medications and avoid sodium.  He will return  for follow up in 3 months.     Follow-up and Dispositions    ?? Return in about 3 months (around 06/01/2018) for Follow up blood pressure.

## 2018-03-03 NOTE — Progress Notes (Signed)
Pt. Is here for Htn.

## 2018-03-22 ENCOUNTER — Encounter

## 2018-03-22 MED ORDER — METOPROLOL SUCCINATE SR 50 MG 24 HR TAB
50 mg | ORAL_TABLET | Freq: Every day | ORAL | 2 refills | Status: AC
Start: 2018-03-22 — End: ?

## 2018-04-01 ENCOUNTER — Encounter

## 2018-04-05 ENCOUNTER — Encounter

## 2018-05-12 MED ORDER — AMLODIPINE 10 MG TAB
10 mg | ORAL_TABLET | ORAL | 1 refills | Status: AC
Start: 2018-05-12 — End: ?

## 2018-05-28 ENCOUNTER — Encounter: Attending: Internal Medicine

## 2018-08-10 ENCOUNTER — Encounter: Attending: Internal Medicine

## 2020-03-15 NOTE — Telephone Encounter (Signed)
LMTCB  Needs cpe

## 2020-03-15 NOTE — Telephone Encounter (Signed)
-----   Message from Joretta Bachelor sent at 03/15/2020 12:18 PM EST -----  Subject: Message to Provider    QUESTIONS  Information for Provider? [place] & has new PCP-updated   preferences & wanted to make Dr aware  ---------------------------------------------------------------------------  --------------  Dennis Pacheco INFO  What is the best way for the office to contact you? OK to leave message on   voicemail  Preferred Call Back Phone Number? [phone]  ---------------------------------------------------------------------------  --------------  SCRIPT ANSWERS  Relationship to Patient? Self
